# Patient Record
Sex: Male | Born: 1960 | Smoking: Never smoker
Health system: Southern US, Community
[De-identification: ages and names within clinical notes are randomized; demographics above are authoritative.]

## PROBLEM LIST (undated history)

## (undated) HISTORY — PX: WRIST SURGERY: SHX841

---

## 1998-07-19 ENCOUNTER — Encounter: Payer: Self-pay | Admitting: Family Medicine

## 1998-07-20 ENCOUNTER — Inpatient Hospital Stay (HOSPITAL_COMMUNITY): Admission: EM | Admit: 1998-07-20 | Discharge: 1998-07-21 | Payer: Self-pay

## 1998-09-14 ENCOUNTER — Ambulatory Visit (HOSPITAL_BASED_OUTPATIENT_CLINIC_OR_DEPARTMENT_OTHER): Admission: RE | Admit: 1998-09-14 | Discharge: 1998-09-14 | Payer: Self-pay | Admitting: Surgery

## 1998-09-18 ENCOUNTER — Ambulatory Visit (HOSPITAL_COMMUNITY): Admission: RE | Admit: 1998-09-18 | Discharge: 1998-09-18 | Payer: Self-pay | Admitting: Surgery

## 1998-09-23 ENCOUNTER — Emergency Department (HOSPITAL_COMMUNITY): Admission: EM | Admit: 1998-09-23 | Discharge: 1998-09-23 | Payer: Self-pay | Admitting: Emergency Medicine

## 2003-07-18 ENCOUNTER — Encounter: Admission: RE | Admit: 2003-07-18 | Discharge: 2003-07-18 | Payer: Self-pay | Admitting: Psychiatry

## 2018-10-08 NOTE — Progress Notes (Deleted)
   New Patient Office Visit  Subjective:  Patient ID: Hunter Alexander, male    DOB: 03/06/1961  Age: 58 y.o. MRN: [id number]  CC: No chief complaint on file.   HPI Hunter Alexander is here to establish as a new pt  No past medical history on file.   No family history on file.  Social History   Socioeconomic History  . Marital status: Unknown    Spouse name: Not on file  . Number of children: Not on file  . Years of education: Not on file  . Highest education level: Not on file  Occupational History  . Not on file  Social Needs  . Financial resource strain: Not on file  . Food insecurity:    Worry: Not on file    Inability: Not on file  . Transportation needs:    Medical: Not on file    Non-medical: Not on file  Tobacco Use  . Smoking status: Not on file  Substance and Sexual Activity  . Alcohol use: Not on file  . Drug use: Not on file  . Sexual activity: Not on file  Lifestyle  . Physical activity:    Days per week: Not on file    Minutes per session: Not on file  . Stress: Not on file  Relationships  . Social connections:    Talks on phone: Not on file    Gets together: Not on file    Attends religious service: Not on file    Active member of club or organization: Not on file    Attends meetings of clubs or organizations: Not on file    Relationship status: Not on file  . Intimate partner violence:    Fear of current or ex partner: Not on file    Emotionally abused: Not on file    Physically abused: Not on file    Forced sexual activity: Not on file  Other Topics Concern  . Not on file  Social History Narrative  . Not on file    ROS Review of Systems  Objective:   Today's Vitals: There were no vitals taken for this visit.  Physical Exam  Assessment & Plan:   Problem List Items Addressed This Visit    None      No outpatient encounter medications on file as of 10/13/2018.   No facility-administered encounter medications on file as of 10/13/2018.      Follow-up: No follow-ups on file.   Julaine Fusi, NP

## 2018-10-13 ENCOUNTER — Ambulatory Visit: Payer: Self-pay | Admitting: Adult Health

## 2019-09-15 ENCOUNTER — Other Ambulatory Visit: Payer: Self-pay

## 2019-09-15 DIAGNOSIS — Z20822 Contact with and (suspected) exposure to covid-19: Secondary | ICD-10-CM

## 2019-09-17 LAB — NOVEL CORONAVIRUS, NAA: SARS-CoV-2, NAA: NOT DETECTED

## 2020-09-06 DEATH — deceased

## 2021-09-05 ENCOUNTER — Emergency Department (HOSPITAL_COMMUNITY): Payer: Commercial Managed Care - PPO

## 2021-09-05 ENCOUNTER — Encounter (HOSPITAL_COMMUNITY): Payer: Self-pay | Admitting: Emergency Medicine

## 2021-09-05 ENCOUNTER — Emergency Department (HOSPITAL_COMMUNITY): Payer: Commercial Managed Care - PPO | Admitting: Anesthesiology

## 2021-09-05 ENCOUNTER — Other Ambulatory Visit: Payer: Self-pay

## 2021-09-05 ENCOUNTER — Encounter (HOSPITAL_COMMUNITY)
Admission: EM | Disposition: A | Discharge status: Home / Self Care | Payer: Self-pay | Source: Home / Self Care | Attending: Emergency Medicine

## 2021-09-05 ENCOUNTER — Ambulatory Visit (HOSPITAL_COMMUNITY): Payer: Commercial Managed Care - PPO

## 2021-09-05 ENCOUNTER — Ambulatory Visit (HOSPITAL_COMMUNITY)
Admission: EM | Admit: 2021-09-05 | Discharge: 2021-09-05 | Disposition: A | Discharge status: Home / Self Care | Payer: Commercial Managed Care - PPO | Attending: Emergency Medicine | Admitting: Emergency Medicine

## 2021-09-05 DIAGNOSIS — E291 Testicular hypofunction: Secondary | ICD-10-CM | POA: Diagnosis not present

## 2021-09-05 DIAGNOSIS — Z20822 Contact with and (suspected) exposure to covid-19: Secondary | ICD-10-CM | POA: Diagnosis not present

## 2021-09-05 DIAGNOSIS — Y92410 Unspecified street and highway as the place of occurrence of the external cause: Secondary | ICD-10-CM | POA: Insufficient documentation

## 2021-09-05 DIAGNOSIS — S2231XA Fracture of one rib, right side, initial encounter for closed fracture: Secondary | ICD-10-CM | POA: Diagnosis not present

## 2021-09-05 DIAGNOSIS — N289 Disorder of kidney and ureter, unspecified: Secondary | ICD-10-CM | POA: Insufficient documentation

## 2021-09-05 DIAGNOSIS — R7989 Other specified abnormal findings of blood chemistry: Secondary | ICD-10-CM | POA: Insufficient documentation

## 2021-09-05 DIAGNOSIS — R739 Hyperglycemia, unspecified: Secondary | ICD-10-CM | POA: Insufficient documentation

## 2021-09-05 DIAGNOSIS — S060XAA Concussion with loss of consciousness status unknown, initial encounter: Secondary | ICD-10-CM | POA: Diagnosis not present

## 2021-09-05 DIAGNOSIS — S42309A Unspecified fracture of shaft of humerus, unspecified arm, initial encounter for closed fracture: Secondary | ICD-10-CM

## 2021-09-05 DIAGNOSIS — S42391A Other fracture of shaft of right humerus, initial encounter for closed fracture: Secondary | ICD-10-CM

## 2021-09-05 DIAGNOSIS — S42301A Unspecified fracture of shaft of humerus, right arm, initial encounter for closed fracture: Secondary | ICD-10-CM | POA: Diagnosis not present

## 2021-09-05 DIAGNOSIS — G5631 Lesion of radial nerve, right upper limb: Secondary | ICD-10-CM | POA: Diagnosis not present

## 2021-09-05 DIAGNOSIS — Z419 Encounter for procedure for purposes other than remedying health state, unspecified: Secondary | ICD-10-CM

## 2021-09-05 HISTORY — PX: ORIF HUMERUS FRACTURE: SHX2126

## 2021-09-05 LAB — ETHANOL: Alcohol, Ethyl (B): <10 mg/dL (ref <10)

## 2021-09-05 LAB — I-STAT CHEM 8, ED
BUN: 25 mg/dL — ABNORMAL HIGH (ref 6–20)
Calcium, Ion: 1.04 mmol/L — ABNORMAL LOW (ref 1.15–1.40)
Chloride: 104 mmol/L (ref 98–111)
Creatinine, Ser: 1.5 mg/dL — ABNORMAL HIGH (ref 0.61–1.24)
Glucose, Bld: 190 mg/dL — ABNORMAL HIGH (ref 70–99)
HCT: 48 % (ref 39.0–52.0)
Hemoglobin: 16.3 g/dL (ref 13.0–17.0)
Potassium: 3 mmol/L — ABNORMAL LOW (ref 3.5–5.1)
Sodium: 139 mmol/L (ref 135–145)
TCO2: 23 mmol/L (ref 22–32)

## 2021-09-05 LAB — PROTIME-INR
INR: 1.2 (ref 0.8–1.2)
Prothrombin Time: 15.5 seconds — ABNORMAL HIGH (ref 11.4–15.2)

## 2021-09-05 LAB — COMPREHENSIVE METABOLIC PANEL
ALT: 139 U/L — ABNORMAL HIGH (ref 0–44)
AST: 175 U/L — ABNORMAL HIGH (ref 15–41)
Albumin: 3.7 g/dL (ref 3.5–5.0)
Alkaline Phosphatase: 51 U/L (ref 38–126)
Anion gap: 11 (ref 5–15)
BUN: 21 mg/dL — ABNORMAL HIGH (ref 6–20)
CO2: 23 mmol/L (ref 22–32)
Calcium: 9.3 mg/dL (ref 8.9–10.3)
Chloride: 103 mmol/L (ref 98–111)
Creatinine, Ser: 1.48 mg/dL — ABNORMAL HIGH (ref 0.61–1.24)
GFR, Estimated: 54 mL/min — ABNORMAL LOW (ref >60)
Glucose, Bld: 189 mg/dL — ABNORMAL HIGH (ref 70–99)
Potassium: 3.1 mmol/L — ABNORMAL LOW (ref 3.5–5.1)
Sodium: 137 mmol/L (ref 135–145)
Total Bilirubin: 0.9 mg/dL (ref 0.3–1.2)
Total Protein: 6.5 g/dL (ref 6.5–8.1)

## 2021-09-05 LAB — URINALYSIS, ROUTINE W REFLEX MICROSCOPIC
Bilirubin Urine: NEGATIVE
Glucose, UA: NEGATIVE mg/dL
Ketones, ur: NEGATIVE mg/dL
Leukocytes,Ua: NEGATIVE
Nitrite: NEGATIVE
Protein, ur: 30 mg/dL — AB
Specific Gravity, Urine: 1.01 (ref 1.005–1.030)
pH: 6.5 (ref 5.0–8.0)

## 2021-09-05 LAB — CBC
HCT: 49.3 % (ref 39.0–52.0)
Hemoglobin: 16.4 g/dL (ref 13.0–17.0)
MCH: 31.1 pg (ref 26.0–34.0)
MCHC: 33.3 g/dL (ref 30.0–36.0)
MCV: 93.4 fL (ref 80.0–100.0)
Platelets: 206 10*3/uL (ref 150–400)
RBC: 5.28 MIL/uL (ref 4.22–5.81)
RDW: 12.7 % (ref 11.5–15.5)
WBC: 12.6 10*3/uL — ABNORMAL HIGH (ref 4.0–10.5)
nRBC: 0 % (ref 0.0–0.2)

## 2021-09-05 LAB — URINALYSIS, MICROSCOPIC (REFLEX): Bacteria, UA: NONE SEEN

## 2021-09-05 LAB — RESP PANEL BY RT-PCR (FLU A&B, COVID) ARPGX2
Influenza A by PCR: NEGATIVE
Influenza B by PCR: NEGATIVE
SARS Coronavirus 2 by RT PCR: NEGATIVE

## 2021-09-05 LAB — SAMPLE TO BLOOD BANK

## 2021-09-05 LAB — LACTIC ACID, PLASMA: Lactic Acid, Venous: 4 mmol/L (ref 0.5–1.9)

## 2021-09-05 SURGERY — OPEN REDUCTION INTERNAL FIXATION (ORIF) PROXIMAL HUMERUS FRACTURE
Anesthesia: General | Site: Arm Upper | Laterality: Right

## 2021-09-05 MED ORDER — FENTANYL CITRATE (PF) 250 MCG/5ML IJ SOLN
INTRAMUSCULAR | Status: DC | PRN
  Administered 2021-09-05 (×3): 50 ug via INTRAVENOUS

## 2021-09-05 MED ORDER — ONDANSETRON HCL 4 MG/2ML IJ SOLN: 4.0000 mg | Freq: Once | INTRAMUSCULAR | Status: DC | PRN

## 2021-09-05 MED ORDER — OXYCODONE HCL 5 MG/5ML PO SOLN: 5.0000 mg | Freq: Once | ORAL | Status: AC | PRN

## 2021-09-05 MED ORDER — LACTATED RINGERS IV SOLN: INTRAVENOUS | Status: DC

## 2021-09-05 MED ORDER — FENTANYL CITRATE PF 50 MCG/ML IJ SOSY
50.0000 ug | PREFILLED_SYRINGE | Freq: Once | INTRAMUSCULAR | Status: AC
  Administered 2021-09-05: 50 ug via INTRAVENOUS
  Filled 2021-09-05: qty 1

## 2021-09-05 MED ORDER — PROPOFOL 10 MG/ML IV BOLUS
INTRAVENOUS | Status: AC
  Filled 2021-09-05: qty 20

## 2021-09-05 MED ORDER — PHENYLEPHRINE HCL-NACL 20-0.9 MG/250ML-% IV SOLN
INTRAVENOUS | Status: DC | PRN
  Administered 2021-09-05: 25 ug/min via INTRAVENOUS

## 2021-09-05 MED ORDER — OXYCODONE HCL 5 MG PO TABS
ORAL_TABLET | ORAL | Status: AC
  Filled 2021-09-05: qty 1

## 2021-09-05 MED ORDER — OXYCODONE-ACETAMINOPHEN 5-325 MG PO TABS: 1.0000 | ORAL_TABLET | ORAL | 0 refills | Status: AC | PRN

## 2021-09-05 MED ORDER — MIDAZOLAM HCL 2 MG/2ML IJ SOLN
INTRAMUSCULAR | Status: AC
  Filled 2021-09-05: qty 2

## 2021-09-05 MED ORDER — ONDANSETRON HCL 4 MG/2ML IJ SOLN
4.0000 mg | Freq: Once | INTRAMUSCULAR | Status: AC
  Administered 2021-09-05: 4 mg via INTRAVENOUS

## 2021-09-05 MED ORDER — MIDAZOLAM HCL 2 MG/2ML IJ SOLN
INTRAMUSCULAR | Status: DC | PRN
  Administered 2021-09-05: 2 mg via INTRAVENOUS

## 2021-09-05 MED ORDER — CEFAZOLIN SODIUM-DEXTROSE 2-4 GM/100ML-% IV SOLN
2.0000 g | Freq: Once | INTRAVENOUS | Status: AC
  Administered 2021-09-05: 2 g via INTRAVENOUS

## 2021-09-05 MED ORDER — DEXAMETHASONE SODIUM PHOSPHATE 10 MG/ML IJ SOLN
INTRAMUSCULAR | Status: DC | PRN
  Administered 2021-09-05: 8 mg via INTRAVENOUS

## 2021-09-05 MED ORDER — FENTANYL CITRATE (PF) 100 MCG/2ML IJ SOLN
INTRAMUSCULAR | Status: AC
  Filled 2021-09-05: qty 2

## 2021-09-05 MED ORDER — SODIUM CHLORIDE 0.9 % IV BOLUS
1000.0000 mL | Freq: Once | INTRAVENOUS | Status: AC
  Administered 2021-09-05: 1000 mL via INTRAVENOUS

## 2021-09-05 MED ORDER — ACETAMINOPHEN 10 MG/ML IV SOLN
INTRAVENOUS | Status: AC
  Filled 2021-09-05: qty 100

## 2021-09-05 MED ORDER — LIDOCAINE 2% (20 MG/ML) 5 ML SYRINGE
INTRAMUSCULAR | Status: DC | PRN
  Administered 2021-09-05: 60 mg via INTRAVENOUS

## 2021-09-05 MED ORDER — SODIUM CHLORIDE 0.9% FLUSH: 3.0000 mL | Freq: Two times a day (BID) | INTRAVENOUS | Status: DC

## 2021-09-05 MED ORDER — PHENYLEPHRINE HCL (PRESSORS) 10 MG/ML IV SOLN
INTRAVENOUS | Status: DC | PRN
  Administered 2021-09-05: 80 ug via INTRAVENOUS

## 2021-09-05 MED ORDER — HYDROMORPHONE HCL 1 MG/ML IJ SOLN
INTRAMUSCULAR | Status: AC
  Filled 2021-09-05: qty 1

## 2021-09-05 MED ORDER — CHLORHEXIDINE GLUCONATE 4 % EX LIQD: 60.0000 mL | Freq: Once | CUTANEOUS | Status: DC

## 2021-09-05 MED ORDER — IOHEXOL 350 MG/ML SOLN
80.0000 mL | Freq: Once | INTRAVENOUS | Status: AC | PRN
  Administered 2021-09-05: 80 mL via INTRAVENOUS

## 2021-09-05 MED ORDER — FENTANYL CITRATE (PF) 250 MCG/5ML IJ SOLN
INTRAMUSCULAR | Status: AC
  Filled 2021-09-05: qty 5

## 2021-09-05 MED ORDER — BUPIVACAINE HCL (PF) 0.5 % IJ SOLN
INTRAMUSCULAR | Status: DC | PRN
  Administered 2021-09-05: 20 mL

## 2021-09-05 MED ORDER — OXYCODONE HCL 5 MG PO TABS
5.0000 mg | ORAL_TABLET | Freq: Once | ORAL | Status: AC | PRN
  Administered 2021-09-05: 5 mg via ORAL

## 2021-09-05 MED ORDER — HYDROMORPHONE HCL 1 MG/ML IJ SOLN
0.2500 mg | INTRAMUSCULAR | Status: DC | PRN
Start: 2021-09-05 — End: 2021-09-05
  Administered 2021-09-05 (×2): 0.5 mg via INTRAVENOUS

## 2021-09-05 MED ORDER — BUPIVACAINE HCL (PF) 0.5 % IJ SOLN
INTRAMUSCULAR | Status: AC
  Filled 2021-09-05: qty 30

## 2021-09-05 MED ORDER — ONDANSETRON HCL 4 MG/2ML IJ SOLN
INTRAMUSCULAR | Status: DC | PRN
  Administered 2021-09-05: 4 mg via INTRAVENOUS

## 2021-09-05 MED ORDER — ORAL CARE MOUTH RINSE: 15.0000 mL | Freq: Once | OROMUCOSAL | Status: AC

## 2021-09-05 MED ORDER — SODIUM CHLORIDE 0.9% FLUSH: 3.0000 mL | INTRAVENOUS | Status: DC | PRN

## 2021-09-05 MED ORDER — POVIDONE-IODINE 10 % EX SWAB: 2.0000 "application " | Freq: Once | CUTANEOUS | Status: DC

## 2021-09-05 MED ORDER — CHLORHEXIDINE GLUCONATE 0.12 % MT SOLN
OROMUCOSAL | Status: AC
  Administered 2021-09-05: 15 mL via OROMUCOSAL
  Filled 2021-09-05: qty 15

## 2021-09-05 MED ORDER — 0.9 % SODIUM CHLORIDE (POUR BTL) OPTIME
TOPICAL | Status: DC | PRN
  Administered 2021-09-05: 1000 mL

## 2021-09-05 MED ORDER — SUGAMMADEX SODIUM 200 MG/2ML IV SOLN
INTRAVENOUS | Status: DC | PRN
  Administered 2021-09-05: 200 mg via INTRAVENOUS

## 2021-09-05 MED ORDER — VANCOMYCIN HCL 1000 MG IV SOLR
INTRAVENOUS | Status: AC
  Filled 2021-09-05: qty 20

## 2021-09-05 MED ORDER — CEFAZOLIN SODIUM-DEXTROSE 2-4 GM/100ML-% IV SOLN: 2.0000 g | INTRAVENOUS | Status: DC

## 2021-09-05 MED ORDER — CHLORHEXIDINE GLUCONATE 0.12 % MT SOLN: 15.0000 mL | Freq: Once | OROMUCOSAL | Status: AC

## 2021-09-05 MED ORDER — VANCOMYCIN HCL 1000 MG IV SOLR
INTRAVENOUS | Status: DC | PRN
  Administered 2021-09-05: 1000 mg via TOPICAL

## 2021-09-05 MED ORDER — SODIUM CHLORIDE 0.9 % IV SOLN: 250.0000 mL | INTRAVENOUS | Status: DC | PRN

## 2021-09-05 MED ORDER — ROCURONIUM BROMIDE 10 MG/ML (PF) SYRINGE
PREFILLED_SYRINGE | INTRAVENOUS | Status: DC | PRN
  Administered 2021-09-05: 100 mg via INTRAVENOUS

## 2021-09-05 MED ORDER — PROPOFOL 10 MG/ML IV BOLUS
INTRAVENOUS | Status: DC | PRN
  Administered 2021-09-05: 100 mg via INTRAVENOUS

## 2021-09-05 SURGICAL SUPPLY — 54 items
BAG COUNTER SPONGE SURGICOUNT (BAG) ×2 IMPLANT
BIT DRILL JC END 3.2X130 (BIT) ×4 IMPLANT
BIT DRILL QC 2.5X180 STRL (BIT) ×2 IMPLANT
BNDG ELASTIC 3X5.8 VLCR STR LF (GAUZE/BANDAGES/DRESSINGS) ×2 IMPLANT
BNDG ELASTIC 4X5.8 VLCR STR LF (GAUZE/BANDAGES/DRESSINGS) ×2 IMPLANT
BNDG ELASTIC 6X5.8 VLCR STR LF (GAUZE/BANDAGES/DRESSINGS) ×2 IMPLANT
BRUSH SCRUB EZ PLAIN DRY (MISCELLANEOUS) ×4 IMPLANT
CHLORAPREP W/TINT 26 (MISCELLANEOUS) ×2 IMPLANT
COVER SURGICAL LIGHT HANDLE (MISCELLANEOUS) ×4 IMPLANT
DRAPE C-ARM 42X72 X-RAY (DRAPES) ×2 IMPLANT
DRAPE HALF SHEET 40X57 (DRAPES) ×2 IMPLANT
DRAPE SURG 17X23 STRL (DRAPES) ×4 IMPLANT
DRAPE U-SHAPE 47X51 STRL (DRAPES) ×4 IMPLANT
DRSG MEPILEX BORDER 4X8 (GAUZE/BANDAGES/DRESSINGS) ×2 IMPLANT
ELECT REM PT RETURN 9FT ADLT (ELECTROSURGICAL) ×2
ELECTRODE REM PT RTRN 9FT ADLT (ELECTROSURGICAL) ×1 IMPLANT
GLOVE SURG ENC MOIS LTX SZ6.5 (GLOVE) ×6 IMPLANT
GLOVE SURG ENC MOIS LTX SZ7.5 (GLOVE) ×6 IMPLANT
GLOVE SURG UNDER POLY LF SZ6.5 (GLOVE) ×2 IMPLANT
GLOVE SURG UNDER POLY LF SZ7.5 (GLOVE) ×2 IMPLANT
GOWN STRL REUS W/ TWL LRG LVL3 (GOWN DISPOSABLE) ×2 IMPLANT
GOWN STRL REUS W/TWL LRG LVL3 (GOWN DISPOSABLE) ×2
KIT BASIN OR (CUSTOM PROCEDURE TRAY) ×2 IMPLANT
KIT TURNOVER KIT B (KITS) ×2 IMPLANT
MANIFOLD NEPTUNE II (INSTRUMENTS) ×2 IMPLANT
NS IRRIG 1000ML POUR BTL (IV SOLUTION) ×2 IMPLANT
PACK SHOULDER (CUSTOM PROCEDURE TRAY) ×2 IMPLANT
PAD ARMBOARD 7.5X6 YLW CONV (MISCELLANEOUS) ×4 IMPLANT
PAD CAST 3X4 CTTN HI CHSV (CAST SUPPLIES) ×1 IMPLANT
PAD CAST 4YDX4 CTTN HI CHSV (CAST SUPPLIES) ×1 IMPLANT
PADDING CAST COTTON 3X4 STRL (CAST SUPPLIES) ×1
PADDING CAST COTTON 4X4 STRL (CAST SUPPLIES) ×2
PLATE LOCKING 9 HOLE (Plate) ×2 IMPLANT
SCREW CORTEX 3.5 24MM (Screw) ×1 IMPLANT
SCREW CORTEX ST 4.5X28 (Screw) ×6 IMPLANT
SCREW CORTEX ST 4.5X30 (Screw) ×4 IMPLANT
SCREW CORTEX ST 4.5X32 (Screw) ×6 IMPLANT
SCREW LOCK CORT ST 3.5X24 (Screw) ×1 IMPLANT
SLING ARM FOAM STRAP XLG (SOFTGOODS) ×2 IMPLANT
SPONGE T-LAP 18X18 ~~LOC~~+RFID (SPONGE) ×2 IMPLANT
STAPLER VISISTAT 35W (STAPLE) ×2 IMPLANT
SUCTION FRAZIER HANDLE 10FR (MISCELLANEOUS) ×2
SUCTION TUBE FRAZIER 10FR DISP (MISCELLANEOUS) ×1 IMPLANT
SUT ETHILON 3 0 FSL (SUTURE) IMPLANT
SUT FIBERWIRE #2 38 T-5 BLUE (SUTURE)
SUT MNCRL AB 3-0 PS2 18 (SUTURE) ×4 IMPLANT
SUT VIC AB 0 CT1 27 (SUTURE) ×2
SUT VIC AB 0 CT1 27XBRD ANBCTR (SUTURE) ×2 IMPLANT
SUT VIC AB 2-0 CT1 27 (SUTURE) ×2
SUT VIC AB 2-0 CT1 TAPERPNT 27 (SUTURE) ×1 IMPLANT
SUTURE FIBERWR #2 38 T-5 BLUE (SUTURE) IMPLANT
TOWEL GREEN STERILE (TOWEL DISPOSABLE) ×4 IMPLANT
TRAY FOLEY MTR SLVR 16FR STAT (SET/KITS/TRAYS/PACK) IMPLANT
WATER STERILE IRR 1000ML POUR (IV SOLUTION) ×2 IMPLANT
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
## 2021-09-05 NOTE — Anesthesia Procedure Notes (Signed)
Procedure Name: Intubation Date/Time: 09/05/2021 10:50 AM Performed by: Clearnce Sorrel, CRNA Pre-anesthesia Checklist: Patient identified, Emergency Drugs available, Suction available and Patient being monitored Patient Re-evaluated:Patient Re-evaluated prior to induction Oxygen Delivery Method: Circle System Utilized Preoxygenation: Pre-oxygenation with 100% oxygen Induction Type: IV induction Ventilation: Mask ventilation without difficulty Laryngoscope Size: Mac, 4 and Glidescope (MP 3, DLx1 without success.) Grade View: Grade III Tube type: Oral Tube size: 7.5 mm Number of attempts: 2 Airway Equipment and Method: Stylet and Oral airway Placement Confirmation: ETT inserted through vocal cords under direct vision, positive ETCO2 and breath sounds checked- equal and bilateral Secured at: 25 cm Tube secured with: Tape Dental Injury: Teeth and Oropharynx as per pre-operative assessment

## 2021-09-05 NOTE — ED Notes (Signed)
Pt to CT and xray  

## 2021-09-05 NOTE — Clinical Note (Incomplete)
[name], any registry patient admitted

## 2021-09-05 NOTE — Interval H&P Note (Signed)
History and Physical Interval Note:  09/05/2021 10:36 AM  Hunter Alexander  has presented today for surgery, with the diagnosis of right humerus fracture.  The various methods of treatment have been discussed with the patient and family. After consideration of risks, benefits and other options for treatment, the patient has consented to  Procedure(s): OPEN REDUCTION INTERNAL FIXATION (ORIF) PROXIMAL HUMERUS FRACTURE (Right) as a surgical intervention.  The patient's history has been reviewed, patient examined, no change in status, stable for surgery.  I have reviewed the patient's chart and labs.  Questions were answered to the patient's satisfaction.     Caryn Bee P Alie Hardgrove

## 2021-09-05 NOTE — Discharge Instructions (Signed)
[facility] Discharge Instructions   General Discharge Instructions  WEIGHT BEARING STATUS:Non-weightbearing right upper extremity  RANGE OF MOTION/ACTIVITY: Ok to come out of sling to begin shoulder and elbow range of motion as tolerated. Use sling as needed for comfort  Wound Care:You may remove your surgical dressing on post-op day #2 (Friday 09/07/21). Incisions can be left open to air if there is no drainage. Okay to begin showering on Saturday 09/08/21, and arm  may get wet at that point if no drainage from incisions.  DVT/PE prophylaxis: None  Diet: as you were eating previously.  Can use over the counter stool softeners and bowel preparations, such as Miralax, to help with bowel movements.  Narcotics can be constipating.  Be sure to drink plenty of fluids  PAIN MEDICATION USE AND EXPECTATIONS  You have likely been given narcotic medications to help control your pain.  After a traumatic event that results in an fracture (broken bone) with or without surgery, it is ok to use narcotic pain medications to help control one's pain.  We understand that everyone responds to pain differently and each individual patient will be evaluated on a regular basis for the continued need for narcotic medications. Ideally, narcotic medication use should last no more than 6-8 weeks (coinciding with fracture healing).   As a patient it is your responsibility as well to monitor narcotic medication use and report the amount and frequency you use these medications when you come to your office visit.   We would also advise that if you are using narcotic medications, you should take a dose prior to therapy to maximize you participation.  IF YOU ARE ON NARCOTIC MEDICATIONS IT IS NOT PERMISSIBLE TO OPERATE A MOTOR VEHICLE (MOTORCYCLE/CAR/TRUCK/MOPED) OR HEAVY MACHINERY DO NOT MIX NARCOTICS WITH OTHER CNS (CENTRAL NERVOUS SYSTEM) DEPRESSANTS SUCH AS ALCOHOL   STOP SMOKING OR USING NICOTINE  PRODUCTS!!!!  As discussed nicotine severely impairs your body's ability to heal surgical and traumatic wounds but also impairs bone healing.  Wounds and bone heal by forming microscopic blood vessels (angiogenesis) and nicotine is a vasoconstrictor (essentially, shrinks blood vessels).  Therefore, if vasoconstriction occurs to these microscopic blood vessels they essentially disappear and are unable to deliver necessary nutrients to the healing tissue.  This is one modifiable factor that you can do to dramatically increase your chances of healing your injury.    (This means no smoking, no nicotine gum, patches, etc)  DO NOT USE NONSTEROIDAL ANTI-INFLAMMATORY DRUGS (NSAID'S)  Using products such as Advil (ibuprofen), Aleve (naproxen), Motrin (ibuprofen) for additional pain control during fracture healing can delay and/or prevent the healing response.  If you would like to take over the counter (OTC) medication, Tylenol (acetaminophen) is ok.  However, some narcotic medications that are given for pain control contain acetaminophen as well. Therefore, you should not exceed more than 4000 mg of tylenol in a day if you do not have liver disease.  Also note that there are may OTC medicines, such as cold medicines and allergy medicines that my contain tylenol as well.  If you have any questions about medications and/or interactions please ask your doctor/PA or your pharmacist.      ICE AND ELEVATE INJURED/OPERATIVE EXTREMITY  Using ice and elevating the injured extremity above your heart can help with swelling and pain control.  Icing in a pulsatile fashion, such as 20 minutes on and 20 minutes off, can be followed.    Do not place ice directly on skin.  Make sure there is a barrier between to skin and the ice pack.    Using frozen items such as frozen peas works well as the conform nicely to the are that needs to be iced.  USE AN ACE WRAP OR TED HOSE FOR SWELLING CONTROL  In addition to icing and elevation,  Ace wraps or TED hose are used to help limit and resolve swelling.  It is recommended to use Ace wraps or TED hose until you are informed to stop.    When using Ace Wraps start the wrapping distally (farthest away from the body) and wrap proximally (closer to the body)   Example: If you had surgery on your leg or thing and you do not have a splint on, start the ace wrap at the toes and work your way up to the thigh        If you had surgery on your upper extremity and do not have a splint on, start the ace wrap at your fingers and work your way up to the upper arm    CALL THE OFFICE WITH ANY QUESTIONS/CONCERNS OR FOR MEDICATION REFILLS: [phone]   VISIT OUR WEBSITE FOR ADDITIONAL INFORMATION: [email or web address]    Discharge Wound Care Instructions  Do NOT apply any ointments, solutions or lotions to pin sites or surgical wounds.  These prevent needed drainage and even though solutions like hydrogen peroxide kill bacteria, they also damage cells lining the pin sites that help fight infection.  Applying lotions or ointments can keep the wounds moist and can cause them to breakdown and open up as well. This can increase the risk for infection. When in doubt call the office.   If any drainage is noted, use one layer of adaptic, then gauze, Kerlix, and an ace wrap.  Once the incision is completely dry and without drainage, it may be left open to air out.  Showering may begin 36-48 hours later.  Cleaning gently with soap and water.
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
## 2021-09-05 NOTE — ED Notes (Signed)
Merry Proud daughter [phone] requesting an update on patient
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
## 2021-09-05 NOTE — Transfer of Care (Signed)
Immediate Anesthesia Transfer of Care Note  Patient: KEMONTAE DUNKLEE  Procedure(s) Performed: OPEN REDUCTION INTERNAL FIXATION (ORIF) PROXIMAL HUMERUS FRACTURE (Right: Arm Upper)  Patient Location: PACU  Anesthesia Type:General  Level of Consciousness: drowsy  Airway & Oxygen Therapy: Patient Spontanous Breathing and Patient connected to nasal cannula oxygen  Post-op Assessment: Report given to RN and Post -op Vital signs reviewed and stable  Post vital signs: Reviewed and stable  Last Vitals:  Vitals Value Taken Time  BP 141/68 09/05/21 1230  Temp    Pulse 80 09/05/21 1230  Resp 20 09/05/21 1232  SpO2 96 % 09/05/21 1230  Vitals shown include unvalidated device data.  Last Pain:  Vitals:   09/05/21 1026  TempSrc: Oral  PainSc:          Complications: No notable events documented.

## 2021-09-05 NOTE — Anesthesia Postprocedure Evaluation (Signed)
Anesthesia Post Note  Patient: Hunter Alexander  Procedure(s) Performed: OPEN REDUCTION INTERNAL FIXATION (ORIF) PROXIMAL HUMERUS FRACTURE (Right: Arm Upper)     Patient location during evaluation: PACU Anesthesia Type: General Level of consciousness: awake and alert, oriented and patient cooperative Pain management: pain level controlled Vital Signs Assessment: post-procedure vital signs reviewed and stable Respiratory status: spontaneous breathing, nonlabored ventilation and respiratory function stable Cardiovascular status: blood pressure returned to baseline and stable Postop Assessment: no apparent nausea or vomiting Anesthetic complications: no   No notable events documented.  Last Vitals:  Vitals:   09/05/21 1325 09/05/21 1330  BP:  117/73  Pulse: 78 79  Resp: (!) 21 18  Temp:    SpO2: 92% 93%    Last Pain:  Vitals:   09/05/21 1330  TempSrc:   PainSc: Asleep                 Lannie Fields
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
## 2021-09-07 ENCOUNTER — Encounter (HOSPITAL_COMMUNITY): Payer: Self-pay | Admitting: Student
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
## 2022-12-29 IMAGING — CR DG HUMERUS 2V *R*
2 series · 2 of 2 positions shown · non-contrast
Comparison: None.

CLINICAL DATA: 60-year-old male [place]
Right upper extremity deformity.

EXAM:
RIGHT HUMERUS - 2+ VIEW

[humerus ap]
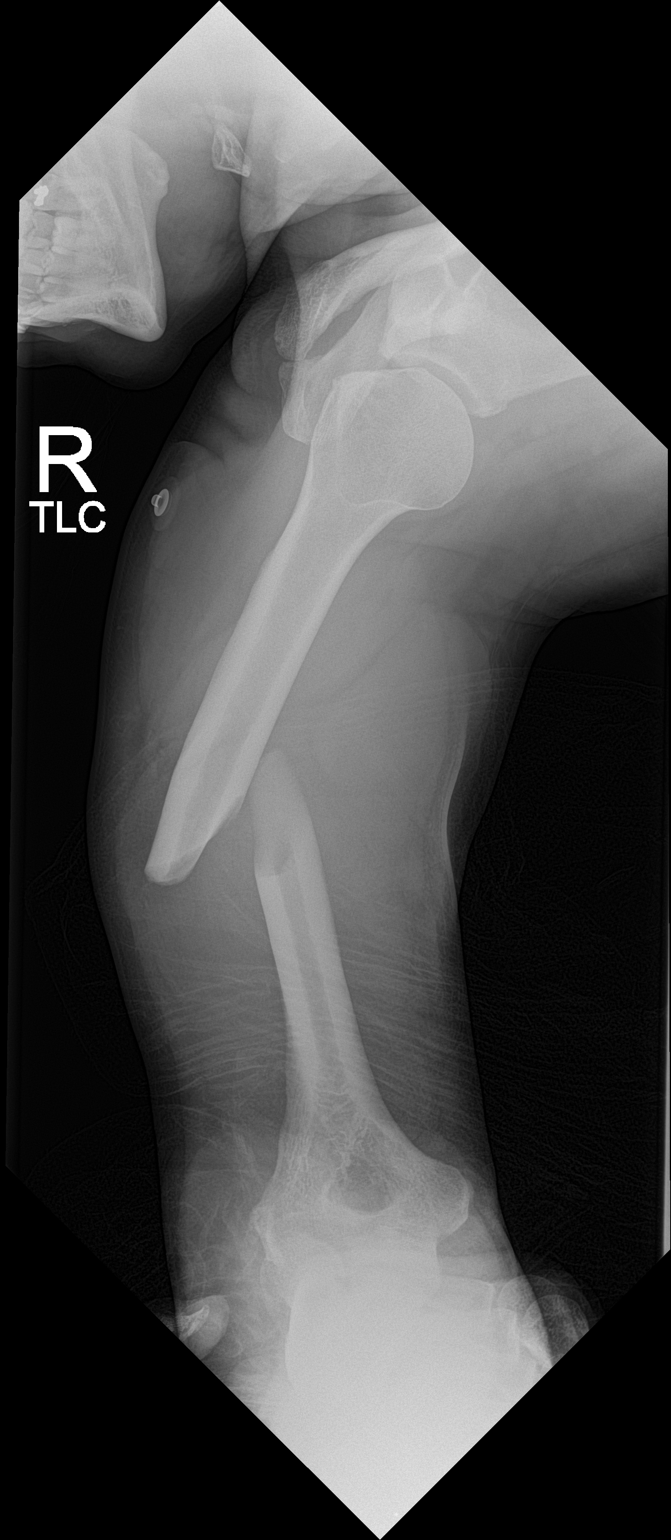

[humerus lat]
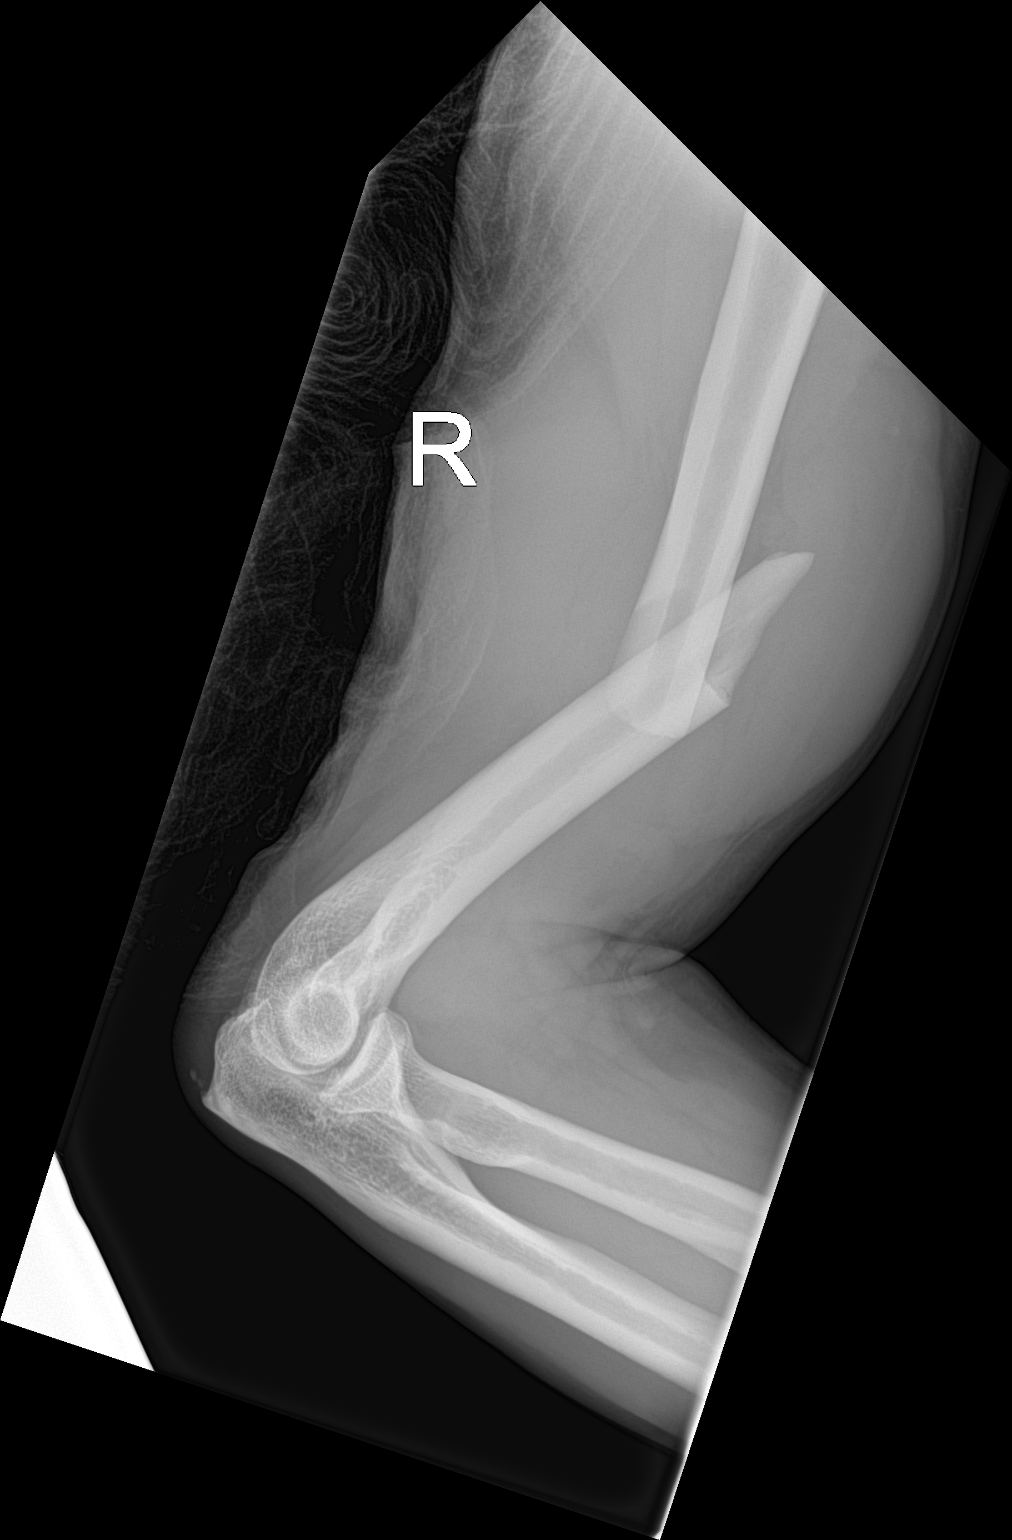

[2 of 2 positions shown; findings below may reference images not displayed]

FINDINGS: Oblique or spiral fracture midshaft right humerus. Fracture length
about 4 cm. Over riding of about 2 cm with acute medial and
posterior angulation, medial displacement.

Grossly maintained alignment at the visible right shoulder and
elbow. Underlying normal bone mineralization.
IMPRESSION: Oblique, angulated, overriding and displaced midshaft right humerus
fracture.
